# Patient Record
Sex: Female | Born: 1990
Health system: Southern US, Community
[De-identification: ages and names within clinical notes are randomized; demographics above are authoritative.]

## PROBLEM LIST (undated history)

## (undated) ENCOUNTER — Inpatient Hospital Stay (HOSPITAL_COMMUNITY): Payer: Self-pay

## (undated) DIAGNOSIS — G43909 Migraine, unspecified, not intractable, without status migrainosus: Secondary | ICD-10-CM

## (undated) DIAGNOSIS — R011 Cardiac murmur, unspecified: Secondary | ICD-10-CM

## (undated) HISTORY — DX: Migraine, unspecified, not intractable, without status migrainosus: G43.909

## (undated) HISTORY — DX: Cardiac murmur, unspecified: R01.1

---

## 2014-09-23 ENCOUNTER — Encounter (HOSPITAL_COMMUNITY): Payer: Self-pay | Admitting: *Deleted

## 2014-09-23 ENCOUNTER — Inpatient Hospital Stay (HOSPITAL_COMMUNITY)
Admission: AD | Admit: 2014-09-23 | Discharge: 2014-09-23 | Disposition: A | Discharge status: Home / Self Care | Payer: BLUE CROSS/BLUE SHIELD | Source: Ambulatory Visit | Attending: Obstetrics & Gynecology | Admitting: Obstetrics & Gynecology

## 2014-09-23 DIAGNOSIS — O26893 Other specified pregnancy related conditions, third trimester: Secondary | ICD-10-CM | POA: Diagnosis present

## 2014-09-23 DIAGNOSIS — Z3A34 34 weeks gestation of pregnancy: Secondary | ICD-10-CM | POA: Insufficient documentation

## 2014-09-23 LAB — URINALYSIS, ROUTINE W REFLEX MICROSCOPIC
Bilirubin Urine: NEGATIVE
Glucose, UA: NEGATIVE mg/dL
Hgb urine dipstick: NEGATIVE
Ketones, ur: NEGATIVE mg/dL
NITRITE: NEGATIVE
PH: 7 (ref 5.0–8.0)
Protein, ur: NEGATIVE mg/dL
SPECIFIC GRAVITY, URINE: 1.015 (ref 1.005–1.030)
UROBILINOGEN UA: 0.2 mg/dL (ref 0.0–1.0)

## 2014-09-23 LAB — URINE MICROSCOPIC-ADD ON
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
## 2015-08-10 ENCOUNTER — Ambulatory Visit: Payer: BLUE CROSS/BLUE SHIELD | Admitting: Family

## 2015-08-14 ENCOUNTER — Telehealth: Payer: Self-pay | Admitting: *Deleted

## 2015-08-14 NOTE — Telephone Encounter (Signed)
Unable to reach patient at time of Pre-Visit Call.  Left message for patient to return call when available.    

## 2015-08-17 ENCOUNTER — Ambulatory Visit (INDEPENDENT_AMBULATORY_CARE_PROVIDER_SITE_OTHER): Payer: PRIVATE HEALTH INSURANCE | Admitting: Family

## 2015-08-17 ENCOUNTER — Telehealth: Payer: Self-pay | Admitting: Family

## 2015-08-17 ENCOUNTER — Encounter: Payer: Self-pay | Admitting: Family

## 2015-08-17 DIAGNOSIS — R41840 Attention and concentration deficit: Secondary | ICD-10-CM | POA: Insufficient documentation

## 2015-08-17 DIAGNOSIS — G43809 Other migraine, not intractable, without status migrainosus: Secondary | ICD-10-CM

## 2015-08-17 DIAGNOSIS — G43909 Migraine, unspecified, not intractable, without status migrainosus: Secondary | ICD-10-CM | POA: Insufficient documentation

## 2015-08-17 DIAGNOSIS — F988 Other specified behavioral and emotional disorders with onset usually occurring in childhood and adolescence: Secondary | ICD-10-CM

## 2015-08-17 NOTE — Assessment & Plan Note (Signed)
Rare migraines, monitor.  

## 2015-08-17 NOTE — Telephone Encounter (Signed)
I placed order under behavioral health. Are you able to view it?

## 2015-08-17 NOTE — Telephone Encounter (Signed)
No unable to see, I normal see them. Not sure whats going on, I will forward to [employer] [facility]

## 2015-08-17 NOTE — Telephone Encounter (Signed)
Thanks

## 2015-08-17 NOTE — Telephone Encounter (Signed)
Could you please call [facility] Charlies Constable PhD) to arrange formal ADHD evaluation?  There is not a psychology referral in epic which will come to you.  Thanks,  [employer]

## 2015-08-17 NOTE — Telephone Encounter (Signed)
The referral must be placed under behavioral health.

## 2015-08-17 NOTE — Assessment & Plan Note (Addendum)
Will refer to Dr. Reggy Eye for formal ADHD evaluation. If positive, will plan to begin medication once patient has completed breast feeding.

## 2015-08-17 NOTE — Progress Notes (Signed)
Pre visit review using our clinic review tool, if applicable. No additional management support is needed unless otherwise documented below in the visit note. 

## 2015-08-17 NOTE — Progress Notes (Signed)
Subjective:    Patient ID: Stephanie Stanley, female    DOB: 09-21-1990, 25 y.o.   MRN: [id number]  HPI  [name] is a 25 yr old female who presents today to establish care.    Her chief complaint today is difficulty focusing at work. She reports a + history of ADD as a child. Reports that she has been written up twice at work. Trouble staying on task.  Feels overwhelmed.  Reports that she overreacts to small things with her children and significant other.  Reports that she gets easily stressed out.  "has to stop herself from snapping." Reports that as a child she was treated with focalin and adderall as a child. She took this through high school. She reports that this helped her focus as a child.  She is currently breast feeding. Her son is 78 months old.   Migraines- reports rare, about 2 times a year. She reports that she attributes this to caffeine.    Childhood hear murmur- does not believe that she still has a murmur.    Review of Systems  Constitutional: Negative for unexpected weight change.  HENT: Negative for hearing loss and rhinorrhea.   Eyes: Negative for visual disturbance.  Respiratory: Negative for cough.   Cardiovascular: Negative for leg swelling.  Gastrointestinal: Negative for constipation and diarrhea.  Genitourinary: Negative for dysuria and frequency.  Musculoskeletal: Negative for arthralgias and myalgias.  Skin: Negative for rash.  Neurological: Positive for headaches.  Hematological: Negative for adenopathy.  Psychiatric/Behavioral:       Denies depression   Past Medical History:  Diagnosis Date  . Heart murmur   . Migraines      Social History   Social History  . Marital status: Single    Spouse name: N/A  . Number of children: N/A  . Years of education: N/A   Occupational History  . medical records     [employer]   Social History Main Topics  . Smoking status: Never Smoker  . Smokeless tobacco: Never Used  . Alcohol use No    . Drug use: No  . Sexual activity: Yes    Birth control/ protection: None   Other Topics Concern  . Not on file   Social History Narrative   Lives with Boyfriend   Son Francesco Sor 10/16   Son Redmond Baseman 2011   Step daughter Al Decant 2011   Completed some college   Enjoys GYM, speding time outside with her kids.       History reviewed. No pertinent surgical history.  Family History  Problem Relation Age of Onset  . Hypertension Father   . Arthritis Maternal Grandmother   . Cancer Paternal Grandfather     prostate    No Known Allergies  No current outpatient prescriptions on file prior to visit.   No current facility-administered medications on file prior to visit.     BP 110/78   Pulse 93   Temp 98.6 F (37 C) (Oral)   Resp 16   Ht  (1.549 m)   Wt 126 lb 12.8 oz (57.5 kg)   LMP 07/18/2015   SpO2 98% Comment: room air  BMI 23.96 kg/m       Objective:   Physical Exam  Constitutional: She is oriented to person, place, and time. She appears well-developed and well-nourished. No distress.  Cardiovascular: Normal rate and regular rhythm.   No murmur heard. Pulmonary/Chest: Effort normal and breath sounds normal. No respiratory distress. She has no  wheezes. She has no rales. She exhibits no tenderness.  Neurological: She is alert and oriented to person, place, and time.  Skin: Skin is warm and dry.  Psychiatric: She has a normal mood and affect. Her behavior is normal. Judgment and thought content normal.          Assessment & Plan:

## 2015-08-17 NOTE — Patient Instructions (Signed)
You will be contacted about your referral to Dr. Reggy Eye for your ADHD evaluation.  Please let me know if you have not heard back about this appointment in 1 week. Please schedule a complete physical at the front desk.

## 2015-08-19 NOTE — Telephone Encounter (Signed)
Thanks, could you just double check with them that this gets scheduled?

## 2015-08-20 NOTE — Telephone Encounter (Signed)
Sure, thanks

## 2015-08-20 NOTE — Telephone Encounter (Signed)
Dr Reggy Eye is booked out, the recommend we schedule with Dr Forbes Cellar, she specializes in ADHD testing, ok to schedule with her?

## 2015-09-18 ENCOUNTER — Ambulatory Visit: Payer: PRIVATE HEALTH INSURANCE | Admitting: Psychology

## 2018-12-04 ENCOUNTER — Other Ambulatory Visit: Payer: Self-pay

## 2018-12-04 DIAGNOSIS — Z20822 Contact with and (suspected) exposure to covid-19: Secondary | ICD-10-CM

## 2018-12-05 LAB — NOVEL CORONAVIRUS, NAA: SARS-CoV-2, NAA: NOT DETECTED

## 2020-01-31 ENCOUNTER — Telehealth: Payer: Self-pay | Admitting: Nurse Practitioner

## 2020-01-31 DIAGNOSIS — J029 Acute pharyngitis, unspecified: Secondary | ICD-10-CM

## 2020-01-31 MED ORDER — AZITHROMYCIN 250 MG PO TABS: ORAL_TABLET | ORAL | 0 refills | Status: AC

## 2020-01-31 NOTE — Addendum Note (Signed)
Addended by: Bennie Pierini on: 01/31/2020 06:21 PM   Modules accepted: Orders

## 2020-01-31 NOTE — Progress Notes (Signed)

## 2020-03-01 ENCOUNTER — Encounter (HOSPITAL_BASED_OUTPATIENT_CLINIC_OR_DEPARTMENT_OTHER): Payer: Self-pay | Admitting: Emergency Medicine

## 2020-03-01 ENCOUNTER — Emergency Department (HOSPITAL_BASED_OUTPATIENT_CLINIC_OR_DEPARTMENT_OTHER)
Admission: EM | Admit: 2020-03-01 | Discharge: 2020-03-01 | Disposition: A | Discharge status: Home / Self Care | Payer: Medicaid Other | Attending: Emergency Medicine | Admitting: Emergency Medicine

## 2020-03-01 ENCOUNTER — Other Ambulatory Visit: Payer: Self-pay

## 2020-03-01 ENCOUNTER — Emergency Department (HOSPITAL_BASED_OUTPATIENT_CLINIC_OR_DEPARTMENT_OTHER): Payer: Medicaid Other

## 2020-03-01 DIAGNOSIS — R0781 Pleurodynia: Secondary | ICD-10-CM | POA: Insufficient documentation

## 2020-03-01 DIAGNOSIS — R6884 Jaw pain: Secondary | ICD-10-CM | POA: Insufficient documentation

## 2020-03-01 NOTE — Discharge Instructions (Signed)
At this time there does not appear to be the presence of an emergent medical condition, however there is always the potential for conditions to change. Please read and follow the below instructions.  Please return to the Emergency Department immediately for any new or worsening symptoms. Please be sure to follow up with your Primary Care Provider within one week regarding your visit today; please call their office to schedule an appointment even if you are feeling better for a follow-up visit. Please be sure you have somewhere safe to go and if you have any concerns about your safety please call 911 immediately.  Go to the nearest Emergency Department immediately if: You have fever or chills You have sudden trouble with breathing. You have noisy breathing. You cough up blood. You cannot swallow. You have: A drooping face. Weakness on one side of your body. Trouble speaking. Trouble understanding what people say. You have any new/concerning or worsening of symptoms   Please read the additional information packets attached to your discharge summary.  Do not take your medicine if  develop an itchy rash, swelling in your mouth or lips, or difficulty breathing; call 911 and seek immediate emergency medical attention if this occurs.  You may review your lab tests and imaging results in their entirety on your MyChart account.  Please discuss all results of fully with your primary care provider and other specialist at your follow-up visit.  Note: Portions of this text may have been transcribed using voice recognition software. Every effort was made to ensure accuracy; however, inadvertent computerized transcription errors may still be present.
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
## 2020-03-02 ENCOUNTER — Encounter: Payer: Self-pay | Admitting: Family

## 2020-04-30 ENCOUNTER — Ambulatory Visit: Payer: Medicaid Other | Admitting: Podiatry

## 2021-06-21 ENCOUNTER — Encounter (HOSPITAL_BASED_OUTPATIENT_CLINIC_OR_DEPARTMENT_OTHER): Payer: Self-pay | Admitting: *Deleted

## 2021-06-21 ENCOUNTER — Emergency Department (HOSPITAL_BASED_OUTPATIENT_CLINIC_OR_DEPARTMENT_OTHER): Payer: Managed Care, Other (non HMO)

## 2021-06-21 ENCOUNTER — Other Ambulatory Visit: Payer: Self-pay

## 2021-06-21 ENCOUNTER — Emergency Department (HOSPITAL_BASED_OUTPATIENT_CLINIC_OR_DEPARTMENT_OTHER)
Admission: EM | Admit: 2021-06-21 | Discharge: 2021-06-21 | Disposition: A | Discharge status: Home / Self Care | Payer: Managed Care, Other (non HMO) | Attending: Emergency Medicine | Admitting: Emergency Medicine

## 2021-06-21 DIAGNOSIS — M79671 Pain in right foot: Secondary | ICD-10-CM | POA: Diagnosis present

## 2021-06-21 MED ORDER — NAPROXEN 375 MG PO TABS: 375.0000 mg | ORAL_TABLET | Freq: Two times a day (BID) | ORAL | 0 refills | Status: AC

## 2021-06-21 NOTE — ED Provider Notes (Signed)
[facility] EMERGENCY DEPARTMENT Provider Note   CSN: [id number] Arrival date & time: 06/21/21  1236     History  Chief Complaint  Patient presents with   Foot Pain    Stephanie Stanley is a 31 y.o. female.  31 year old female presents today for evaluation of pain over the right forefoot of about 1.5-week duration.  Started when patient was running on treadmill.  She states she initially attributed this to her shoe however this has not improved.  States is worse with walking.  She has not run since onset.  Has been taking ibuprofen with mild relief.  Denies other complaints.  The history is provided by the patient. No language interpreter was used.      Home Medications Prior to Admission medications   Medication Sig Start Date End Date Taking? Authorizing Provider  azithromycin (ZITHROMAX Z-PAK) 250 MG tablet As directed 01/31/20   Daphine Deutscher, Mary-Margaret, FNP  norethindrone (MICRONOR) 0.35 MG tablet Take 1 tablet by mouth daily.    [provider]      Allergies    Patient has no known allergies.    Review of Systems   Review of Systems  Constitutional:  Negative for chills and fever.  Musculoskeletal:  Positive for arthralgias. Negative for joint swelling.  Skin:  Negative for wound.  All other systems reviewed and are negative.  Physical Exam Updated Vital Signs BP 117/73 (BP Location: Right Arm)   Pulse 70   Temp 98.2 F (36.8 C)   Resp 16   Ht 5\' 1"  (1.549 m)   Wt 61.7 kg   SpO2 100%   BMI 25.70 kg/m  Physical Exam Vitals and nursing note reviewed.  Constitutional:      General: She is not in acute distress.    Appearance: Normal appearance. She is not ill-appearing.  HENT:     Head: Normocephalic and atraumatic.     Nose: Nose normal.  Eyes:     Conjunctiva/sclera: Conjunctivae normal.  Pulmonary:     Effort: Pulmonary effort is normal. No respiratory distress.  Musculoskeletal:        General: No deformity.     Comments: Full  range of motion of the right ankle, all toes on the right foot.  Tenderness to palpation present over the right forefoot.  Ankle joint without tenderness to palpation.  2+ DP pulse present.  Brisk cap refill.  Skin:    Findings: No rash.  Neurological:     Mental Status: She is alert.    ED Results / Procedures / Treatments   Labs (all labs ordered are listed, but only abnormal results are displayed) Labs Reviewed - No data to display  EKG None  Radiology No results found.  Procedures Procedures    Medications Ordered in ED Medications - No data to display  ED Course/ Medical Decision Making/ A&P Clinical Course as of 06/21/21 1402  Mon Jun 21, 2021  1354 DG Foot Complete Right [AA]    Clinical Course User Index [AA] Jun 23, 2021, PA-C                           Medical Decision Making Amount and/or Complexity of Data Reviewed Radiology: ordered.   31 year old female presents today for evaluation of pain over the right forefoot which has been ongoing for about 1.5 weeks.  Has been taking ibuprofen without significant relief.  X-ray without evidence of acute fracture.  Will provide naproxen and podiatry  follow-up as needed.  Return precautions discussed.  Patient voices understanding and is in agreement with plan.  Exam is reassuring.  She is neurovascularly intact.   Final Clinical Impression(s) / ED Diagnoses Final diagnoses:  Foot pain, right    Rx / DC Orders ED Discharge Orders          Ordered    naproxen (NAPROSYN) 375 MG tablet  2 times daily        06/21/21 1403              Marita Kansas, PA-C 06/21/21 1405    Melene Plan, DO 06/21/21 1502

## 2021-06-21 NOTE — Discharge Instructions (Signed)
Your x-ray today was negative for fracture.  You likely have a muscle strain, or hairline fracture that was not seen on x-ray today.  Continue resting your foot.  I have sent naproxen into the pharmacy for you.  Have also attached podiatry follow-up for you in case you have persistent issues.  If you have any worsening symptoms you can return to the emergency room.

## 2021-06-21 NOTE — ED Notes (Signed)
Has strong rt pedal and rt post tib pulse, capillary refill WNL, temp WNL

## 2021-06-21 NOTE — ED Triage Notes (Signed)
Complains of Rt foot pain. Approx 1 week ago was on treadmill, running, had sudden pain "on top of my foot" after several days cont to having pain on rt foot, hurts to bend toes, pain increases with walking.

## 2022-01-11 ENCOUNTER — Other Ambulatory Visit: Payer: Self-pay

## 2022-01-11 ENCOUNTER — Emergency Department (HOSPITAL_BASED_OUTPATIENT_CLINIC_OR_DEPARTMENT_OTHER)
Admission: EM | Admit: 2022-01-11 | Discharge: 2022-01-11 | Discharge status: Against Medical Advice | Payer: Managed Care, Other (non HMO) | Attending: Emergency Medicine | Admitting: Emergency Medicine

## 2022-01-11 DIAGNOSIS — R519 Headache, unspecified: Secondary | ICD-10-CM | POA: Insufficient documentation

## 2022-01-11 DIAGNOSIS — Z5321 Procedure and treatment not carried out due to patient leaving prior to being seen by health care provider: Secondary | ICD-10-CM | POA: Insufficient documentation

## 2022-01-11 DIAGNOSIS — R0981 Nasal congestion: Secondary | ICD-10-CM | POA: Diagnosis not present

## 2022-01-11 DIAGNOSIS — M791 Myalgia, unspecified site: Secondary | ICD-10-CM | POA: Diagnosis not present

## 2022-01-11 NOTE — ED Notes (Signed)
Refused covid/flu swab and medication for fever.

## 2022-01-11 NOTE — ED Notes (Signed)
No answer for room x3 

## 2022-01-11 NOTE — ED Triage Notes (Signed)
Patient presents to ED via POV from home. Here with nasal congestion, headache and body aches. Recently dx with sinus infection.
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
## 2023-07-23 IMAGING — DX DG FOOT COMPLETE 3+V*R*
3 series · 3 of 3 positions shown · non-contrast
Comparison: None Available.

CLINICAL DATA: Forefoot pain. Pain started 1 on treadmill. Hurts to
bend toes.

EXAM:
RIGHT FOOT COMPLETE - 3+ VIEW

[foot ap]
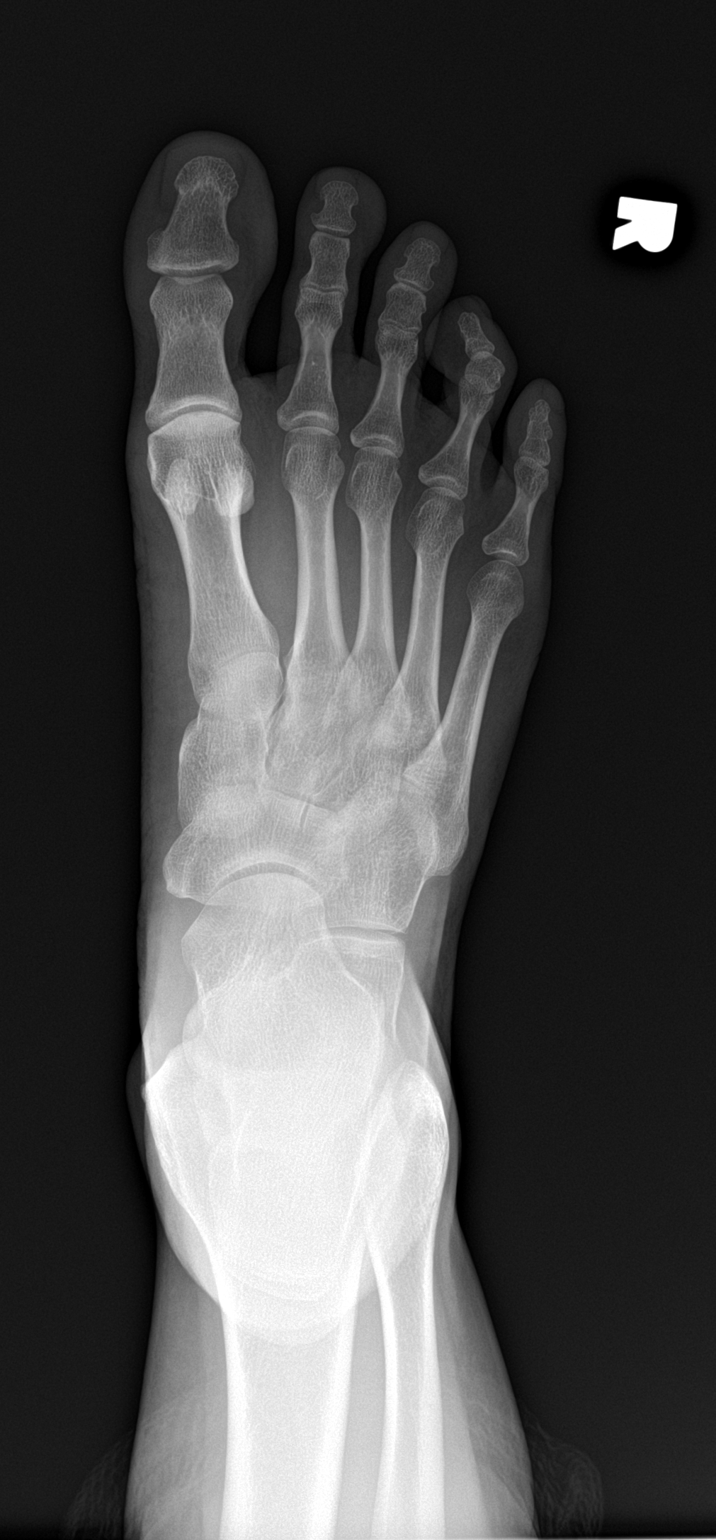

[foot obl]
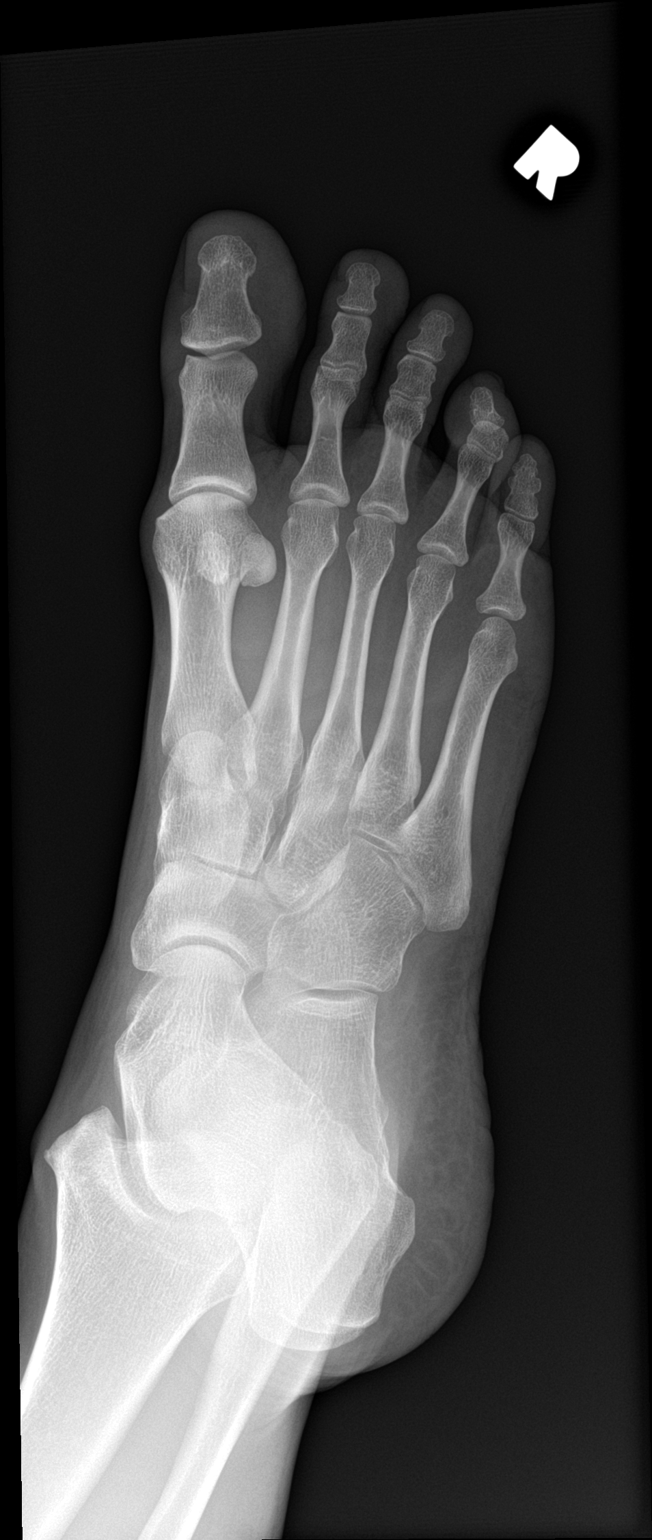

[foot lat]
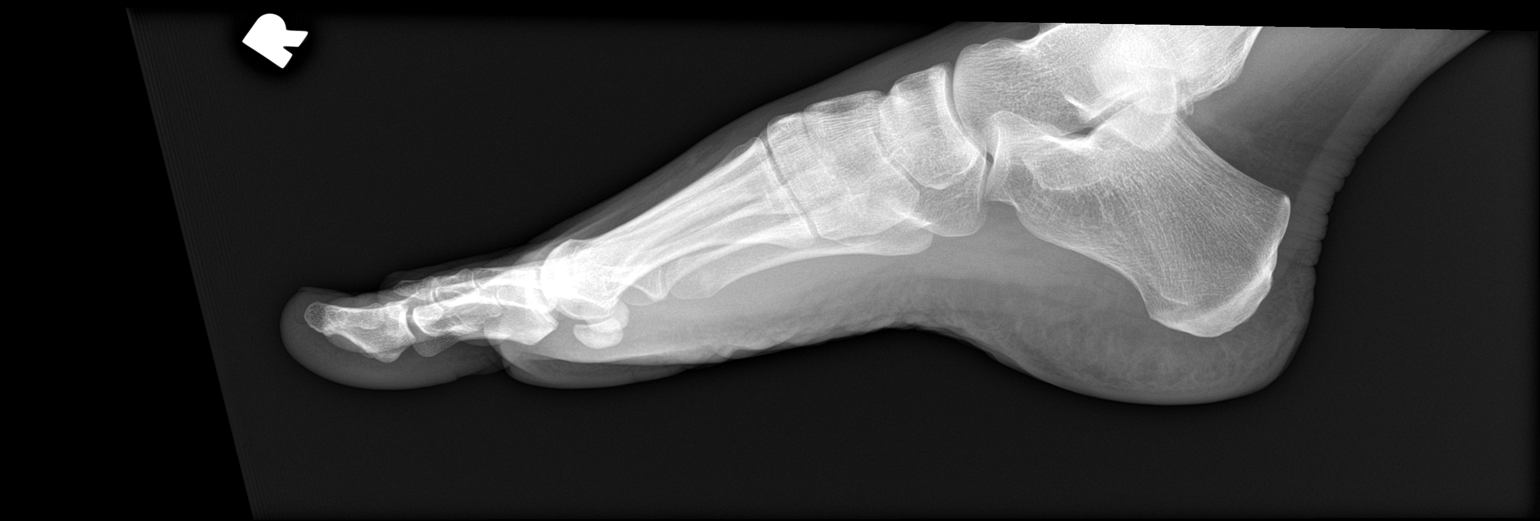

[3 of 3 positions shown; findings below may reference images not displayed]

FINDINGS: Normal bone mineralization. Joint spaces are preserved. No acute
fracture is seen. No dislocation. Joint spaces are preserved.
IMPRESSION: Normal right foot radiographs.
